# Patient Record
Sex: Male | Born: 1975 | Race: Black or African American | Hispanic: No | Marital: Single | Smoking: Former smoker
Health system: Southern US, Community
[De-identification: ages and names within clinical notes are randomized; demographics above are authoritative.]

## PROBLEM LIST (undated history)

## (undated) DIAGNOSIS — E119 Type 2 diabetes mellitus without complications: Secondary | ICD-10-CM

## (undated) DIAGNOSIS — E785 Hyperlipidemia, unspecified: Secondary | ICD-10-CM

## (undated) DIAGNOSIS — M1990 Unspecified osteoarthritis, unspecified site: Secondary | ICD-10-CM

## (undated) HISTORY — DX: Type 2 diabetes mellitus without complications: E11.9

## (undated) HISTORY — DX: Unspecified osteoarthritis, unspecified site: M19.90

## (undated) HISTORY — DX: Hyperlipidemia, unspecified: E78.5

## (undated) HISTORY — PX: VASECTOMY: SHX75

---

## 2016-08-27 ENCOUNTER — Ambulatory Visit: Payer: Self-pay

## 2017-05-20 ENCOUNTER — Telehealth: Payer: Self-pay | Admitting: Internal Medicine

## 2017-05-20 NOTE — Telephone Encounter (Signed)
Patient is interested in establishing care with Dr. Yetta BarreJones.  Please advise.

## 2017-05-21 NOTE — Telephone Encounter (Signed)
yes

## 2017-05-25 NOTE — Telephone Encounter (Signed)
Patient scheduled.

## 2017-06-08 ENCOUNTER — Encounter: Payer: Self-pay | Admitting: Internal Medicine

## 2017-06-08 ENCOUNTER — Ambulatory Visit (INDEPENDENT_AMBULATORY_CARE_PROVIDER_SITE_OTHER)
Admission: RE | Admit: 2017-06-08 | Discharge: 2017-06-08 | Disposition: A | Discharge status: Home / Self Care | Payer: Managed Care, Other (non HMO) | Source: Ambulatory Visit | Attending: Internal Medicine | Admitting: Internal Medicine

## 2017-06-08 ENCOUNTER — Ambulatory Visit: Payer: Managed Care, Other (non HMO) | Admitting: Internal Medicine

## 2017-06-08 ENCOUNTER — Other Ambulatory Visit (INDEPENDENT_AMBULATORY_CARE_PROVIDER_SITE_OTHER): Payer: Managed Care, Other (non HMO)

## 2017-06-08 VITALS — BP 124/80 | HR 61 | Temp 98.3°F | Resp 16 | Ht 72.0 in | Wt 313.0 lb

## 2017-06-08 DIAGNOSIS — Z23 Encounter for immunization: Secondary | ICD-10-CM

## 2017-06-08 DIAGNOSIS — M545 Low back pain, unspecified: Secondary | ICD-10-CM

## 2017-06-08 DIAGNOSIS — G8929 Other chronic pain: Secondary | ICD-10-CM

## 2017-06-08 DIAGNOSIS — Z0000 Encounter for general adult medical examination without abnormal findings: Secondary | ICD-10-CM

## 2017-06-08 DIAGNOSIS — E781 Pure hyperglyceridemia: Secondary | ICD-10-CM

## 2017-06-08 DIAGNOSIS — M159 Polyosteoarthritis, unspecified: Secondary | ICD-10-CM

## 2017-06-08 DIAGNOSIS — E785 Hyperlipidemia, unspecified: Secondary | ICD-10-CM

## 2017-06-08 DIAGNOSIS — E118 Type 2 diabetes mellitus with unspecified complications: Secondary | ICD-10-CM

## 2017-06-08 DIAGNOSIS — R195 Other fecal abnormalities: Secondary | ICD-10-CM

## 2017-06-08 DIAGNOSIS — M25511 Pain in right shoulder: Secondary | ICD-10-CM | POA: Diagnosis not present

## 2017-06-08 DIAGNOSIS — H9193 Unspecified hearing loss, bilateral: Secondary | ICD-10-CM | POA: Diagnosis not present

## 2017-06-08 LAB — URINALYSIS, ROUTINE W REFLEX MICROSCOPIC
BILIRUBIN URINE: NEGATIVE
Hgb urine dipstick: NEGATIVE
KETONES UR: NEGATIVE
Leukocytes, UA: NEGATIVE
NITRITE: NEGATIVE
PH: 7.5 (ref 5.0–8.0)
RBC / HPF: NONE SEEN (ref 0–?)
SPECIFIC GRAVITY, URINE: 1.015 (ref 1.000–1.030)
Total Protein, Urine: NEGATIVE
URINE GLUCOSE: NEGATIVE
UROBILINOGEN UA: 1 (ref 0.0–1.0)
WBC, UA: NONE SEEN (ref 0–?)

## 2017-06-08 LAB — MICROALBUMIN / CREATININE URINE RATIO
Creatinine,U: 160.4 mg/dL
Microalb Creat Ratio: 0.4 mg/g (ref 0.0–30.0)

## 2017-06-08 LAB — LIPID PANEL
CHOL/HDL RATIO: 7
CHOLESTEROL: 244 mg/dL — AB (ref 0–200)
HDL: 35.6 mg/dL — ABNORMAL LOW (ref >39.00)
NonHDL: 208.14
Triglycerides: 327 mg/dL — ABNORMAL HIGH (ref 0.0–149.0)
VLDL: 65.4 mg/dL — AB (ref 0.0–40.0)

## 2017-06-08 LAB — COMPREHENSIVE METABOLIC PANEL
ALT: 19 U/L (ref 0–53)
AST: 16 U/L (ref 0–37)
Albumin: 4.3 g/dL (ref 3.5–5.2)
Alkaline Phosphatase: 60 U/L (ref 39–117)
BUN: 14 mg/dL (ref 6–23)
CHLORIDE: 103 meq/L (ref 96–112)
CO2: 29 meq/L (ref 19–32)
CREATININE: 1.06 mg/dL (ref 0.40–1.50)
Calcium: 9.7 mg/dL (ref 8.4–10.5)
GFR: 97.94 mL/min (ref >60.00)
GLUCOSE: 114 mg/dL — AB (ref 70–99)
POTASSIUM: 3.8 meq/L (ref 3.5–5.1)
Sodium: 139 mEq/L (ref 135–145)
Total Bilirubin: 0.5 mg/dL (ref 0.2–1.2)
Total Protein: 7.1 g/dL (ref 6.0–8.3)

## 2017-06-08 LAB — CBC WITH DIFFERENTIAL/PLATELET
Basophils Absolute: 0.1 10*3/uL (ref 0.0–0.1)
Basophils Relative: 0.7 % (ref 0.0–3.0)
EOS PCT: 2.1 % (ref 0.0–5.0)
Eosinophils Absolute: 0.2 10*3/uL (ref 0.0–0.7)
HCT: 43.8 % (ref 39.0–52.0)
Hemoglobin: 15.1 g/dL (ref 13.0–17.0)
LYMPHS ABS: 2.3 10*3/uL (ref 0.7–4.0)
Lymphocytes Relative: 25.8 % (ref 12.0–46.0)
MCHC: 34.5 g/dL (ref 30.0–36.0)
MCV: 85.4 fl (ref 78.0–100.0)
MONO ABS: 0.6 10*3/uL (ref 0.1–1.0)
MONOS PCT: 7 % (ref 3.0–12.0)
NEUTROS ABS: 5.7 10*3/uL (ref 1.4–7.7)
NEUTROS PCT: 64.4 % (ref 43.0–77.0)
PLATELETS: 248 10*3/uL (ref 150.0–400.0)
RBC: 5.13 Mil/uL (ref 4.22–5.81)
RDW: 14.7 % (ref 11.5–15.5)
WBC: 8.9 10*3/uL (ref 4.0–10.5)

## 2017-06-08 LAB — TSH: TSH: 1.91 u[IU]/mL (ref 0.35–4.50)

## 2017-06-08 LAB — HEMOGLOBIN A1C: Hgb A1c MFr Bld: 6.3 % (ref 4.6–6.5)

## 2017-06-08 LAB — LDL CHOLESTEROL, DIRECT: LDL DIRECT: 153 mg/dL

## 2017-06-08 LAB — PSA: PSA: 0.91 ng/mL (ref 0.10–4.00)

## 2017-06-08 NOTE — Patient Instructions (Signed)

## 2017-06-08 NOTE — Progress Notes (Signed)
Subjective:  Patient ID: Tyrone Brandt, male    DOB: 03/12/1976  Age: 41 y.o. MRN: [id number]  CC: Back Pain; Osteoarthritis; Diabetes; and Annual Exam  NEW TO ME  HPI [name] presents for a CPX.  He complains of a several month history of aching in his lower back and right shoulder.  He works in [employer] but does not recall any specific trauma or injury.  He tells me he has a history of osteoarthritis.  He wants to have his shoulder and neck low back x-rayed.  The back pain does not radiate into his lower extremities and no weakness, tingling.  Tells me he has a history of type 2 diabetes mellitus which he is controlling with lifestyle modifications. He has chronic hearing loss and wants to have his hearing tested.    No outpatient medications prior to visit.   No facility-administered medications prior to visit.     ROS Review of Systems  Constitutional: Negative.  Negative for diaphoresis, fatigue and unexpected weight change.  HENT: Positive for hearing loss. Negative for ear discharge and ear pain.   Eyes: Negative.   Respiratory: Negative.  Negative for apnea, cough, choking, shortness of breath and wheezing.   Cardiovascular: Negative for chest pain, palpitations and leg swelling.  Gastrointestinal: Negative for abdominal pain, anal bleeding, blood in stool, constipation, diarrhea, nausea, rectal pain and vomiting.  Endocrine: Negative.   Genitourinary: Negative.  Negative for difficulty urinating, dysuria, penile swelling, scrotal swelling, testicular pain and urgency.  Musculoskeletal: Positive for arthralgias and back pain. Negative for myalgias and neck pain.  Skin: Negative.  Negative for color change.  Allergic/Immunologic: Negative.   Neurological: Negative.  Negative for dizziness, weakness, light-headedness and numbness.  Hematological: Negative for adenopathy. Does not bruise/bleed easily.  Psychiatric/Behavioral: Negative.     Objective:  BP  124/80 (BP Location: Left Arm, Patient Position: Sitting, Cuff Size: Large)   Pulse 61   Temp 98.3 F (36.8 C) (Oral)   Resp 16   Ht 6' (1.829 m)   Wt (!) 313 lb (142 kg)   SpO2 98%   BMI 42.45 kg/m   BP Readings from Last 3 Encounters:  06/08/17 124/80    Wt Readings from Last 3 Encounters:  06/08/17 (!) 313 lb (142 kg)    Physical Exam  Constitutional: He is oriented to person, place, and time. No distress.  HENT:  Mouth/Throat: Oropharynx is clear and moist. No oropharyngeal exudate.  Eyes: Conjunctivae are normal. Right eye exhibits no discharge. Left eye exhibits no discharge. No scleral icterus.  Neck: Normal range of motion. Neck supple. No JVD present. No thyromegaly present.  Cardiovascular: Normal rate, regular rhythm and intact distal pulses. Exam reveals no gallop and no friction rub.  No murmur heard. Pulmonary/Chest: Effort normal and breath sounds normal. No respiratory distress. He has no wheezes. He has no rales.  Abdominal: Soft. Bowel sounds are normal. He exhibits no distension and no mass. There is no tenderness. There is no rebound and no guarding. Hernia confirmed negative in the right inguinal area and confirmed negative in the left inguinal area.  Genitourinary: Testes normal and penis normal. Rectal exam shows internal hemorrhoid and guaiac positive stool. Rectal exam shows no external hemorrhoid, no fissure, no mass, no tenderness and anal tone normal. Prostate is not enlarged and not tender. Right testis shows no mass, no swelling and no tenderness. Right testis is descended. Left testis shows no mass, no swelling and no tenderness. Left testis is  descended. Circumcised. No penile erythema or penile tenderness. No discharge found.  Musculoskeletal: Normal range of motion. He exhibits no edema, tenderness or deformity.       Right shoulder: Normal. He exhibits normal range of motion, no tenderness, no swelling, no effusion and no deformity.       Lumbar  back: Normal. He exhibits normal range of motion, no tenderness, no bony tenderness, no swelling, no edema and no deformity.  Neg SLR in BLE  Lymphadenopathy:    He has no cervical adenopathy.       Right: No inguinal adenopathy present.       Left: No inguinal adenopathy present.  Neurological: He is alert and oriented to person, place, and time. He has normal reflexes. He displays normal reflexes. No cranial nerve deficit. He exhibits normal muscle tone. Coordination normal.  Skin: Skin is warm and dry. No rash noted. He is not diaphoretic. No erythema. No pallor.  Vitals reviewed.   Lab Results  Component Value Date   WBC 8.9 06/08/2017   HGB 15.1 06/08/2017   HCT 43.8 06/08/2017   PLT 248.0 06/08/2017   GLUCOSE 114 (H) 06/08/2017   CHOL 244 (H) 06/08/2017   TRIG 327.0 (H) 06/08/2017   HDL 35.60 (L) 06/08/2017   LDLDIRECT 153.0 06/08/2017   ALT 19 06/08/2017   AST 16 06/08/2017   NA 139 06/08/2017   K 3.8 06/08/2017   CL 103 06/08/2017   CREATININE 1.06 06/08/2017   BUN 14 06/08/2017   CO2 29 06/08/2017   TSH 1.91 06/08/2017   PSA 0.91 06/08/2017   HGBA1C 6.3 06/08/2017   MICROALBUR <0.7 06/08/2017    Patient was never admitted.  Assessment & Plan:   [name] was seen today for back pain, osteoarthritis, diabetes and annual exam.  Diagnoses and all orders for this visit:  Occult blood in stools-he is Hemoccult positive today.  This could be explained by the internal hemorrhoids.  I have asked him to see GI to see if he needs to consider lower endoscopy to screen for colon pathology. -     Ambulatory referral to Gastroenterology  Type 2 diabetes mellitus with complication, without long-term current use of insulin (HCC)- his A1c is at 6.3%.  His blood sugars are adequately well controlled with lifestyle modifications. -     Hemoglobin A1c; Future -     Microalbumin / creatinine urine ratio; Future  Bilateral hearing loss, unspecified hearing loss type- I have  referred him for hearing evaluation.  Routine general medical examination at a health care facility- exam completed, labs reviewed, vaccines reviewed and updated, patient education material was given. -     Lipid panel; Future -     Comprehensive metabolic panel; Future -     CBC with Differential/Platelet; Future -     TSH; Future -     Urinalysis, Routine w reflex microscopic; Future -     PSA; Future  Generalized OA- though he has symptoms he does not want to take a medication to treat this.  Hyperlipidemia with target LDL less than 100- he has a low ASCVD risk score so I do not recommend that he take a statin for CV risk reduction.  Chronic right shoulder pain- exam and x-ray are normal.  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  He does not want to treat this. -     DG Shoulder Right; Future  Chronic low back pain without sciatica, unspecified back pain laterality-the description of the  back pain is benign.  His plain films are normal and he is neurologically intact.  This is consistent with benign musculoskeletal low back pain.  He does not want to have this treated. -     DG Lumbar Spine Complete; Future  Need for Tdap vaccination -     Tdap vaccine greater than or equal to 7yo IM  Pure hyperglyceridemia -     omega-3 acid ethyl esters (LOVAZA) 1 g capsule; Take 2 capsules (2 g total) 2 (two) times daily by mouth.   I am having Bradey Livingston start on omega-3 acid ethyl esters.  Meds ordered this encounter  Medications  . omega-3 acid ethyl esters (LOVAZA) 1 g capsule    Sig: Take 2 capsules (2 g total) 2 (two) times daily by mouth.    Dispense:  360 capsule    Refill:  1     Follow-up: Return in about 4 months (around 10/06/2017).  Sanda Lingerhomas Catelyn Friel, MD

## 2017-06-09 ENCOUNTER — Encounter: Payer: Self-pay | Admitting: Internal Medicine

## 2017-06-09 DIAGNOSIS — E781 Pure hyperglyceridemia: Secondary | ICD-10-CM | POA: Insufficient documentation

## 2017-06-09 MED ORDER — OMEGA-3-ACID ETHYL ESTERS 1 G PO CAPS: 2.0000 g | ORAL_CAPSULE | Freq: Two times a day (BID) | ORAL | 1 refills | Status: AC

## 2017-06-11 ENCOUNTER — Encounter: Payer: Self-pay | Admitting: Internal Medicine

## 2017-08-05 ENCOUNTER — Encounter: Payer: Self-pay | Admitting: Internal Medicine

## 2017-10-06 ENCOUNTER — Ambulatory Visit: Payer: Managed Care, Other (non HMO) | Admitting: Internal Medicine

## 2017-10-06 DIAGNOSIS — Z0289 Encounter for other administrative examinations: Secondary | ICD-10-CM

## 2019-04-24 IMAGING — DX DG LUMBAR SPINE COMPLETE 4+V
5 series · 5 of 5 positions shown · non-contrast
Comparison: None.

CLINICAL DATA: Low back pain for 1 year.

EXAM:
LUMBAR SPINE - COMPLETE 4+ VIEW

[l-spine ap]
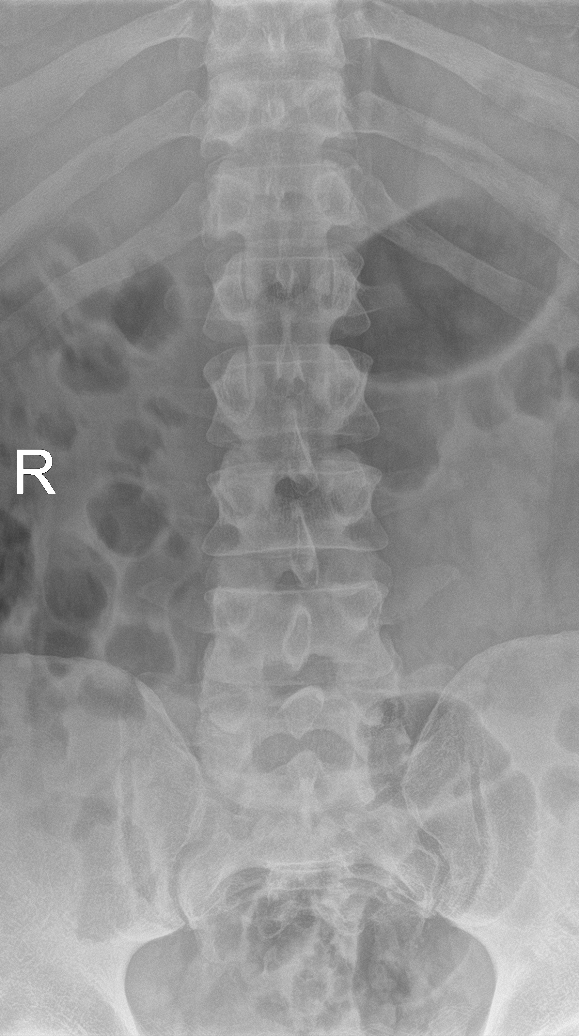

[l-spine obl (1 of 2)]
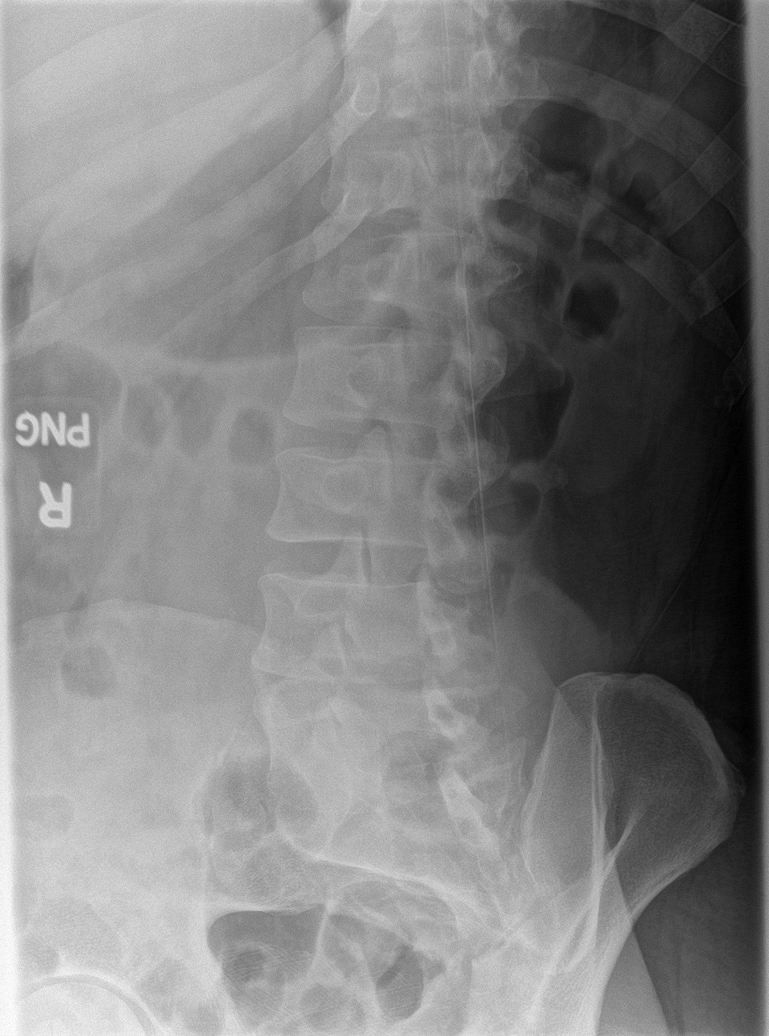

[l-spine obl (2 of 2)]
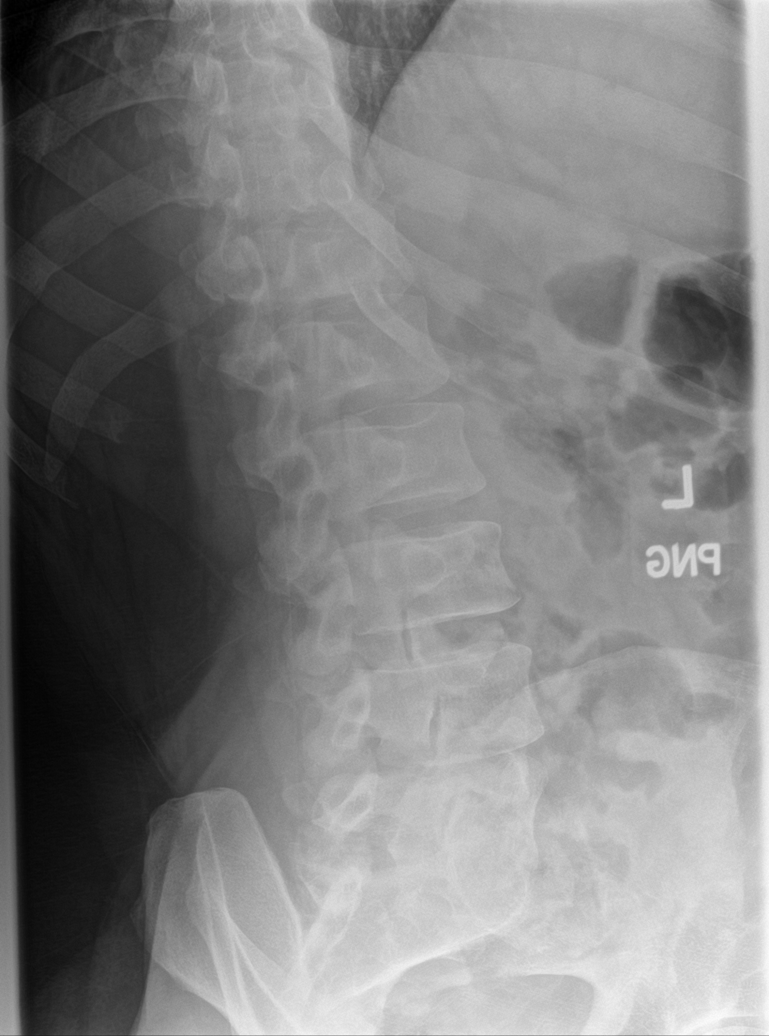

[l-spine lat]
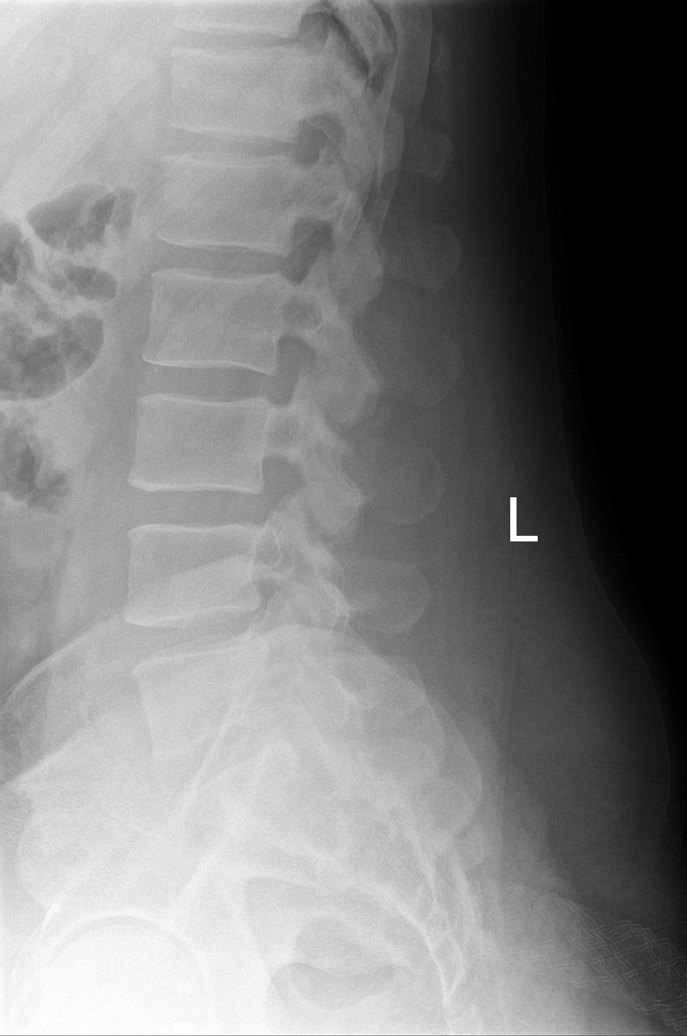

[l-spine spot]
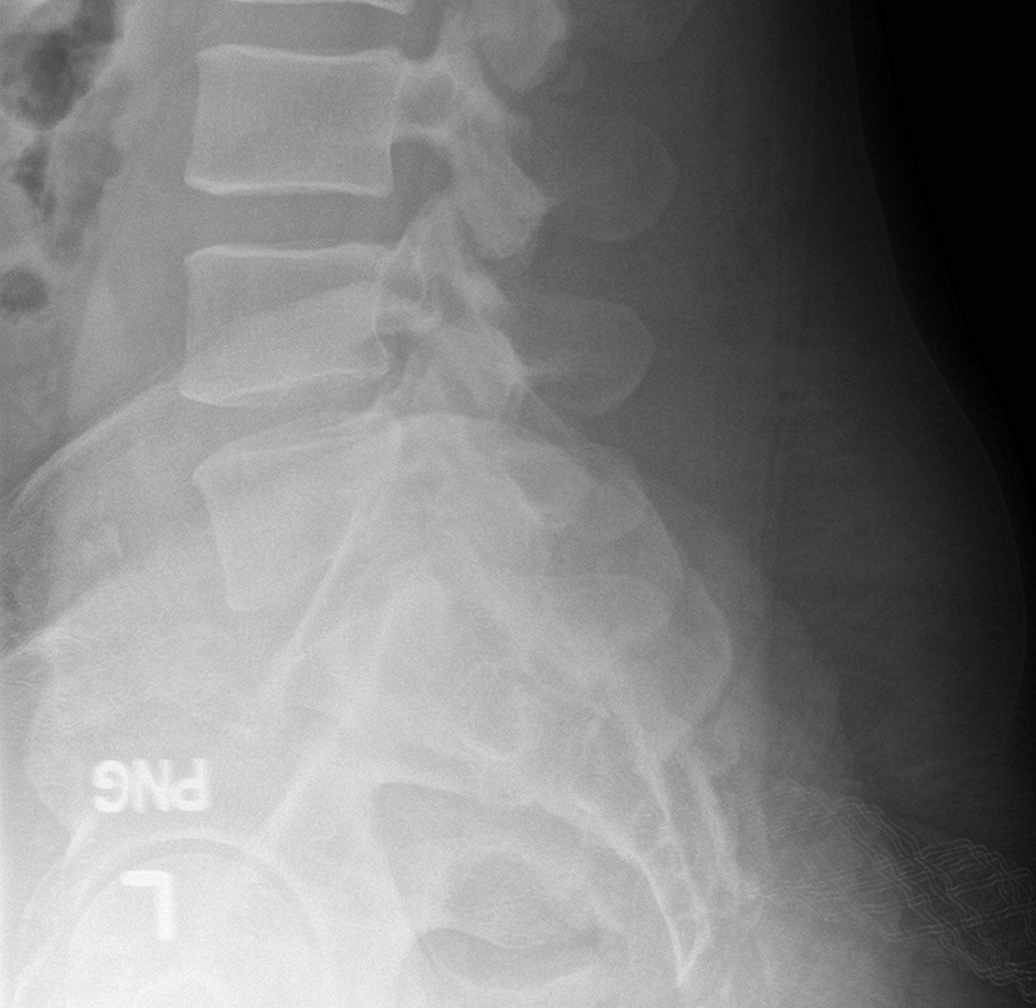

[5 of 5 positions shown; findings below may reference images not displayed]

FINDINGS: There is no evidence of lumbar spine fracture. Alignment is normal.
Intervertebral disc spaces are maintained. No other bone lesions
identified.
IMPRESSION: Negative.
